# Patient Record
Sex: Female | Born: 1995 | Race: White | Hispanic: No | Marital: Married | Smoking: Never smoker
Health system: Southern US, Community
[De-identification: ages and names within clinical notes are randomized; demographics above are authoritative.]

## PROBLEM LIST (undated history)

## (undated) DIAGNOSIS — K900 Celiac disease: Secondary | ICD-10-CM

## (undated) HISTORY — PX: OTHER SURGICAL HISTORY: SHX169

## (undated) HISTORY — PX: TONSILLECTOMY: SUR1361

## (undated) HISTORY — PX: COLONOSCOPY: SHX174

---

## 2018-12-22 ENCOUNTER — Emergency Department (HOSPITAL_COMMUNITY)
Admission: EM | Admit: 2018-12-22 | Discharge: 2018-12-22 | Disposition: A | Discharge status: Home / Self Care | Payer: BC Managed Care – PPO | Attending: Emergency Medicine | Admitting: Emergency Medicine

## 2018-12-22 ENCOUNTER — Other Ambulatory Visit: Payer: Self-pay

## 2018-12-22 ENCOUNTER — Emergency Department (HOSPITAL_COMMUNITY): Payer: BC Managed Care – PPO

## 2018-12-22 DIAGNOSIS — R51 Headache: Secondary | ICD-10-CM | POA: Diagnosis not present

## 2018-12-22 DIAGNOSIS — R0789 Other chest pain: Secondary | ICD-10-CM | POA: Insufficient documentation

## 2018-12-22 DIAGNOSIS — R110 Nausea: Secondary | ICD-10-CM | POA: Insufficient documentation

## 2018-12-22 DIAGNOSIS — R079 Chest pain, unspecified: Secondary | ICD-10-CM

## 2018-12-22 DIAGNOSIS — Z79899 Other long term (current) drug therapy: Secondary | ICD-10-CM | POA: Insufficient documentation

## 2018-12-22 DIAGNOSIS — F419 Anxiety disorder, unspecified: Secondary | ICD-10-CM | POA: Insufficient documentation

## 2018-12-22 DIAGNOSIS — R5383 Other fatigue: Secondary | ICD-10-CM | POA: Diagnosis not present

## 2018-12-22 LAB — CBC
HCT: 42.2 % (ref 36.0–46.0)
Hemoglobin: 14.1 g/dL (ref 12.0–15.0)
MCH: 29.5 pg (ref 26.0–34.0)
MCHC: 33.4 g/dL (ref 30.0–36.0)
MCV: 88.3 fL (ref 80.0–100.0)
Platelets: 264 10*3/uL (ref 150–400)
RBC: 4.78 MIL/uL (ref 3.87–5.11)
RDW: 12.7 % (ref 11.5–15.5)
WBC: 6.3 10*3/uL (ref 4.0–10.5)
nRBC: 0 % (ref 0.0–0.2)

## 2018-12-22 LAB — BASIC METABOLIC PANEL
Anion gap: 12 (ref 5–15)
BUN: 5 mg/dL — ABNORMAL LOW (ref 6–20)
CO2: 24 mmol/L (ref 22–32)
Calcium: 9.8 mg/dL (ref 8.9–10.3)
Chloride: 104 mmol/L (ref 98–111)
Creatinine, Ser: 0.98 mg/dL (ref 0.44–1.00)
GFR calc Af Amer: >60 mL/min (ref >60)
GFR calc non Af Amer: >60 mL/min (ref >60)
Glucose, Bld: 93 mg/dL (ref 70–99)
Potassium: 3.7 mmol/L (ref 3.5–5.1)
Sodium: 140 mmol/L (ref 135–145)

## 2018-12-22 LAB — I-STAT BETA HCG BLOOD, ED (MC, WL, AP ONLY): I-stat hCG, quantitative: <5 m[IU]/mL (ref <5)

## 2018-12-22 LAB — TROPONIN I: Troponin I: <0.03 ng/mL (ref <0.03)

## 2018-12-22 MED ORDER — LACTATED RINGERS IV BOLUS
1000.0000 mL | Freq: Once | INTRAVENOUS | Status: AC
  Administered 2018-12-22: 1000 mL via INTRAVENOUS

## 2018-12-22 MED ORDER — KETOROLAC TROMETHAMINE 15 MG/ML IJ SOLN
15.0000 mg | Freq: Once | INTRAMUSCULAR | Status: AC
  Administered 2018-12-22: 15 mg via INTRAVENOUS
  Filled 2018-12-22: qty 1

## 2018-12-22 MED ORDER — DIPHENHYDRAMINE HCL 50 MG/ML IJ SOLN
12.5000 mg | Freq: Once | INTRAMUSCULAR | Status: AC
  Administered 2018-12-22: 12.5 mg via INTRAVENOUS
  Filled 2018-12-22: qty 1

## 2018-12-22 MED ORDER — PROCHLORPERAZINE EDISYLATE 10 MG/2ML IJ SOLN
10.0000 mg | Freq: Once | INTRAMUSCULAR | Status: AC
  Administered 2018-12-22: 10 mg via INTRAVENOUS
  Filled 2018-12-22: qty 2

## 2018-12-22 NOTE — ED Provider Notes (Signed)
[facility]ERG[facility] DEPARTMENT Provider Note   CSN: [id number] Arrival date & time: 12/22/18  1850    History   Chief Complaint No chief complaint on file.   HPI Samantha Armstrong is a 23 y.o. female.  HPI: A 23 year old patient with a history of obesity presents for evaluation of chest pain. Initial onset of pain was more than 6 hours ago. The patient's chest pain is described as heaviness/pressure/tightness and is not worse with exertion. The patient's chest pain is middle- or left-sided, is not well-localized, is not sharp and does not radiate to the arms/jaw/neck. The patient does not complain of nausea and denies diaphoresis. The patient has no history of stroke, has no history of peripheral artery disease, has not smoked in the past 90 days, denies any history of treated diabetes, has no relevant family history of coronary artery disease (first degree relative at less than age 24), is not hypertensive and has no history of hypercholesterolemia.   The history is provided by the patient and medical records.  Chest Pain Pain location:  L chest and L lateral chest Pain quality: aching, burning and tightness   Pain radiates to:  Mid back and upper back (left sided upper back, right sided lower back) Pain severity:  Mild Onset quality:  Unable to specify Duration:  3 days Timing:  Intermittent Progression:  Waxing and waning Chronicity:  Recurrent Context: breathing, movement, raising an arm, at rest and stress   Context: not trauma   Relieved by:  Nothing Worsened by:  Nothing Ineffective treatments:  Certain positions, leaning forward and rest Associated symptoms: anxiety and fatigue   Associated symptoms: no abdominal pain, no cough, no diaphoresis, no fever, no lower extremity edema, no orthopnea, no palpitations, no PND, no shortness of breath and no vomiting   Risk factors: obesity   Risk factors: no birth control, not female and no prior DVT/PE     No  past medical history on file.  There are no active problems to display for this patient.      OB History   No obstetric history on file.      Home Medications    Prior to Admission medications   Medication Sig Start Date End Date Taking? Authorizing Provider  busPIRone (BUSPAR) 5 MG tablet Take 5 mg by mouth 2 (two) times a day. for anxiety 11/10/18  Yes [provider]  ferrous sulfate 324 (65 Fe) MG TBEC Take 1 tablet by mouth daily. 12/04/18  Yes [provider]  omeprazole (PRILOSEC) 20 MG capsule Take 20 mg by mouth daily.   Yes [provider]    Family History No family history on file.  Social History Social History   Tobacco Use   Smoking status: Not on file  Substance Use Topics   Alcohol use: Not on file   Drug use: Not on file     Allergies   Other   Review of Systems Review of Systems  Constitutional: Positive for fatigue. Negative for diaphoresis and fever.  Respiratory: Negative for cough and shortness of breath.   Cardiovascular: Positive for chest pain. Negative for palpitations, orthopnea and PND.  Gastrointestinal: Negative for abdominal pain and vomiting.  All other systems reviewed and are negative.    Physical Exam Updated Vital Signs BP 117/82    Pulse 69    Temp 98.4 F (36.9 C)    Resp 12    LMP 12/21/2018 (Within Days)    SpO2 100%  Physical Exam Vitals signs and nursing note reviewed.  Constitutional:      Appearance: She is well-developed. She is not toxic-appearing.  HENT:     Head: Normocephalic and atraumatic.     Mouth/Throat:     Mouth: Mucous membranes are moist.  Eyes:     Extraocular Movements: Extraocular movements intact.     Conjunctiva/sclera: Conjunctivae normal.  Neck:     Musculoskeletal: Neck supple. No muscular tenderness.  Cardiovascular:     Rate and Rhythm: Normal rate.  Pulmonary:     Effort: Pulmonary effort is normal.     Breath sounds: No stridor. No wheezing or  rhonchi.  Abdominal:     Palpations: Abdomen is soft.     Tenderness: There is no abdominal tenderness.  Musculoskeletal:     Right lower leg: No edema.     Left lower leg: No edema.  Skin:    General: Skin is warm and dry.     Capillary Refill: Capillary refill takes less than 2 seconds.     Findings: No rash.  Neurological:     Mental Status: She is alert and oriented to person, place, and time.      ED Treatments / Results  Labs (all labs ordered are listed, but only abnormal results are displayed) Labs Reviewed  BASIC METABOLIC PANEL - Abnormal; Notable for the following components:      Result Value   BUN 5 (*)    All other components within normal limits  CBC  TROPONIN I  I-STAT BETA HCG BLOOD, ED ([facility], [facility], [facility] ONLY)    EKG EKG Interpretation  Date/Time:  Monday December 22 2018 20:27:53 EDT Ventricular Rate:  80 PR Interval:  174 QRS Duration: 103 QT Interval:  402 QTC Calculation: 464 R Axis:   60 Text Interpretation:  Sinus rhythm no acute ST/T changes No old tracing to compare Confirmed by Sherwood Gambler [phone]) on 12/22/2018 8:41:12 PM   Radiology Dg Chest 2 View  Result Date: 12/22/2018 CLINICAL DATA:  Chest pain, back pain EXAM: CHEST - 2 VIEW COMPARISON:  None. FINDINGS: Heart and mediastinal contours are within normal limits. No focal opacities or effusions. No acute bony abnormality. IMPRESSION: No active cardiopulmonary disease. Electronically Signed   By: Rolm Baptise M.D.   On: 12/22/2018 19:41    Procedures Procedures (including critical care time)  Medications Ordered in ED Medications  lactated ringers bolus 1,000 mL (1,000 mLs Intravenous New Bag/Given 12/22/18 2111)  ketorolac (TORADOL) 15 MG/ML injection 15 mg (15 mg Intravenous Given 12/22/18 2112)  diphenhydrAMINE (BENADRYL) injection 12.5 mg (12.5 mg Intravenous Given 12/22/18 2112)  prochlorperazine (COMPAZINE) injection 10 mg (10 mg Intravenous Given 12/22/18 2111)     Initial  Impression / Assessment and Plan / ED Course  I have reviewed the triage vital signs and the nursing notes.  Pertinent labs & imaging results that were available during my care of the patient were reviewed by me and considered in my medical decision making (see chart for details).     HEAR Score: 2  Medical Decision Making: Samantha Armstrong is a 23 y.o. female who presented to the ED today with chest pain, headache.  Past medical history significant for celiac's disease Reviewed and confirmed nursing documentation for past medical history, family history, social history.  On my initial exam, the pt was calm, cooperative, conversant, follows commands appropriately, GCS 15, not tachycardic, not hypotensive, afebrile, no increased work of breathing or respiratory distress, no signs of impending  respiratory failure.   The patient's risk factors for ACS were reviewed as well as the EKG. The CXR assists in r/o Pneumonia, Pneumothorax, Esophageal Tears.  No focal lung findings suggestive of pneumonia or pneumothorax. The patient does not appear to have a Pulmonary Embolism based on the Wells Score and PERC rule and there is no apparent asymmetric upper extremity or lower extremity edema/swelling suggestive of DVT.  Patient denies any fevers, change in chest pain with leaning forward or lying down making pericarditis, myocarditis less likely. There does not appear to be an Aortic Dissection either based on physical exam, historically pain not abrupt in onset, tearing or ripping, pulses symmetric. MSK strain vs Costochondritis are also of consideration however (-)Rooster crowing sign, pain not reproducible with palpation. Patient also having headache, describes headache as typical, not worst headache of life, severity in ED 4 out of 10, maximal severity 7 out of 10, no new red flag symptoms, no focal neuro deficits on exam, moves all extremities well, ambulates at baseline, no new medications, afebrile,  denies any IV drug use history, no saddle anesthesia or urinary incontinence  EKG (my interpretation):      NS rhythm with a rate of  80.      QRS 103. QTc 464.      No acute ischemic ST-T segment changes.      No acute changes suggestive of hyperkalemia.      No WPW, LQTS, or Brugada's Syndrome.      No prior EKGs for comparison  HEAR score 2 Troponin negative X-ray showed no emergent abnormality  All radiology and laboratory studies reviewed independently and with my attending physician, agree with reading provided by radiologist unless otherwise noted.  Upon reassessing patient, patient was calm, no new complaints, feeling improved after headache cocktail Based on the above findings, I believe patient is hemodynamically stable for discharge.  Patient educated about specific return precautions for given chief complaint and symptoms.  Patient educated about follow-up with PCP.  Patient expressed understanding of return precautions and need for follow-up.  Patient discharged.  The above care was discussed with and agreed upon by my attending physician. Emergency Department Medication Summary:  Medications  lactated ringers bolus 1,000 mL (1,000 mLs Intravenous New Bag/Given 12/22/18 2111)  ketorolac (TORADOL) 15 MG/ML injection 15 mg (15 mg Intravenous Given 12/22/18 2112)  diphenhydrAMINE (BENADRYL) injection 12.5 mg (12.5 mg Intravenous Given 12/22/18 2112)  prochlorperazine (COMPAZINE) injection 10 mg (10 mg Intravenous Given 12/22/18 2111)    Final Clinical Impressions(s) / ED Diagnoses   Final diagnoses:  Chest pain in adult    ED Discharge Orders    None       Lonzo Candy, MD 12/22/18 2224    Sherwood Gambler, MD 12/23/18 1242

## 2018-12-22 NOTE — ED Triage Notes (Signed)
Pt here from home with chest pain and back pain along with some slight nausea and sob

## 2018-12-22 NOTE — Discharge Instructions (Signed)

## 2018-12-24 ENCOUNTER — Emergency Department (HOSPITAL_COMMUNITY): Payer: BC Managed Care – PPO

## 2018-12-24 ENCOUNTER — Encounter (HOSPITAL_COMMUNITY): Payer: Self-pay

## 2018-12-24 ENCOUNTER — Other Ambulatory Visit: Payer: Self-pay

## 2018-12-24 ENCOUNTER — Emergency Department (HOSPITAL_COMMUNITY)
Admission: EM | Admit: 2018-12-24 | Discharge: 2018-12-25 | Disposition: A | Discharge status: Home / Self Care | Payer: BC Managed Care – PPO | Attending: Emergency Medicine | Admitting: Emergency Medicine

## 2018-12-24 DIAGNOSIS — M25561 Pain in right knee: Secondary | ICD-10-CM | POA: Insufficient documentation

## 2018-12-24 DIAGNOSIS — Z79899 Other long term (current) drug therapy: Secondary | ICD-10-CM | POA: Diagnosis not present

## 2018-12-24 HISTORY — DX: Celiac disease: K90.0

## 2018-12-24 LAB — D-DIMER, QUANTITATIVE: D-Dimer, Quant: <0.27 ug/mL-FEU (ref 0.00–0.50)

## 2018-12-24 LAB — CBC
HCT: 41.8 % (ref 36.0–46.0)
Hemoglobin: 13.9 g/dL (ref 12.0–15.0)
MCH: 29.4 pg (ref 26.0–34.0)
MCHC: 33.3 g/dL (ref 30.0–36.0)
MCV: 88.6 fL (ref 80.0–100.0)
Platelets: 226 10*3/uL (ref 150–400)
RBC: 4.72 MIL/uL (ref 3.87–5.11)
RDW: 12.4 % (ref 11.5–15.5)
WBC: 6.3 10*3/uL (ref 4.0–10.5)
nRBC: 0 % (ref 0.0–0.2)

## 2018-12-24 NOTE — ED Provider Notes (Signed)
[facility] EMERGENCY DEPARTMENT Provider Note   CSN: [id number] Arrival date & time: 12/24/18  2110    History   Chief Complaint Chief Complaint  Patient presents with  . Knee Pain    HPI Samantha Armstrong is a 23 y.o. female with a history of celiac disease and anemia who presents to the emergency department with a chief complaint of right knee pain.  The patient reports that she was driving with her right foot on the gas when she felt sudden onset, severe pain in her right knee.  She characterizes the pain as pinching.  The pain it radiates up the side of the thigh and down the side of the calf.  She reports that the pain was so severe in intensity that she had to pull over her car and was concerned that she would not be able to finish driving, but was ultimately able to finish her trip.  She reports that later today she noticed new bruising on her right shin.   She denies right ankle or hip pain, fever, chills, S, numbness, new trauma, injuries, or falls.  No history of right knee surgery.  No new exercise regimens.  No history of similar pain.  He has been ambulatory since the onset of pain.  She takes an iron supplement for anemia.  She reports that both her mother and grandfather have had multiple blood clots.  She does not take birth control.  No recent long travel.  She reports that she was seen in the ER earlier this week for chest pain, but denies chest pain or shortness of breath at this time.  No treatment prior to arrival.     The history is provided by the patient. No language interpreter was used.    Past Medical History:  Diagnosis Date  . Celiac disease     There are no active problems to display for this patient.   Past Surgical History:  Procedure Laterality Date  . COLONOSCOPY    . TONSILLECTOMY    . tubes in ears       OB History    Gravida  0   Para  0   Term  0   Preterm  0   AB  0   Living  0     SAB  0   TAB  0   Ectopic  0   Multiple  0   Live Births  0            Home Medications    Prior to Admission medications   Medication Sig Start Date End Date Taking? Authorizing Provider  busPIRone (BUSPAR) 5 MG tablet Take 5 mg by mouth 2 (two) times a day. for anxiety 11/10/18   [provider]  ferrous sulfate 324 (65 Fe) MG TBEC Take 1 tablet by mouth daily. 12/04/18   [provider]  omeprazole (PRILOSEC) 20 MG capsule Take 20 mg by mouth daily.    [provider]    Family History History reviewed. No pertinent family history.  Social History Social History   Tobacco Use  . Smoking status: Never Smoker  . Smokeless tobacco: Never Used  Substance Use Topics  . Alcohol use: Never    Frequency: Never  . Drug use: Never     Allergies   Other   Review of Systems Review of Systems  Constitutional: Negative for activity change, chills and fever.  Respiratory: Negative for shortness of breath.   Cardiovascular:  Negative for chest pain.  Gastrointestinal: Negative for abdominal pain.  Musculoskeletal: Positive for arthralgias and myalgias. Negative for back pain, neck pain and neck stiffness.  Skin: Positive for color change. Negative for rash.  Neurological: Negative for weakness and numbness.  Hematological: Bruises/bleeds easily.     Physical Exam Updated Vital Signs BP (!) 133/92 (BP Location: Right Arm)   Pulse 61   Temp 97.8 F (36.6 C) (Oral)   Resp 16   Ht 5\' 4"  (1.626 m)   Wt 97.5 kg   LMP 12/21/2018 (Within Days)   SpO2 100%   BMI 36.90 kg/m   Physical Exam Vitals signs and nursing note reviewed.  Constitutional:      General: She is not in acute distress. HENT:     Head: Normocephalic.  Eyes:     Conjunctiva/sclera: Conjunctivae normal.  Neck:     Musculoskeletal: Neck supple.  Cardiovascular:     Rate and Rhythm: Normal rate and regular rhythm.     Heart sounds: No murmur. No friction rub. No gallop.   Pulmonary:      Effort: Pulmonary effort is normal. No respiratory distress.     Breath sounds: No stridor. No wheezing, rhonchi or rales.  Chest:     Chest wall: No tenderness.  Abdominal:     General: There is no distension.     Palpations: Abdomen is soft.  Musculoskeletal:     Right lower leg: No edema.     Left lower leg: No edema.     Comments: Tender to palpation to the lateral joint line of the right knee.  She is also diffusely tender to the distal portion of the lateral right thigh and proximal portion of the lateral right calf.  There are several small bruises noted along the anterior aspect of the right lower leg.  She is also tender to palpation to the posterior aspect of the right knee.  She is neurovascularly intact.  Full active and passive range of motion of the right hip, knee, and ankle.  Pain is increased in the right knee with active range of motion.  Right hip and ankle are nontender.  There is no tenderness along the medial joint line.  Patella, quadriceps tendon, and patellar tendon are nontender.  Skin:    General: Skin is warm.     Findings: No rash.  Neurological:     Mental Status: She is alert.  Psychiatric:        Behavior: Behavior normal.      ED Treatments / Results  Labs (all labs ordered are listed, but only abnormal results are displayed) Labs Reviewed  D-DIMER, QUANTITATIVE (NOT AT [facility])  CBC    EKG None  Radiology Dg Knee Complete 4 Views Right  Result Date: 12/24/2018 CLINICAL DATA:  23 year old with atraumatic right knee pain. EXAM: RIGHT KNEE - COMPLETE 4+ VIEW COMPARISON:  None. FINDINGS: No evidence of fracture, dislocation, or joint effusion. No evidence of arthropathy or other focal bone abnormality. Soft tissues are unremarkable. IMPRESSION: Negative radiographs of the right knee. Electronically Signed   By: Narda RutherfordMelanie  Sanford M.D.   On: 12/24/2018 23:24    Procedures Procedures (including critical care time)  Medications Ordered in ED  Medications - No data to display   Initial Impression / Assessment and Plan / ED Course  I have reviewed the triage vital signs and the nursing notes.  Pertinent labs & imaging results that were available during my care of the patient were reviewed  by me and considered in my medical decision making (see chart for details).        23 year old female with history of celiac disease and anemia presenting with atraumatic right knee pain that radiates along the lateral portion of the right thigh and down the lateral portion of the right calf that began earlier today while the patient was driving her car.  She noticed some bruising along the shin that developed sometime after the pain began.  Family history includes VTE.  Differential diagnosis includes DVT, IT band syndrome, ruptured Baker's cyst, and LCL pathology.   Low suspicion for DVT as d-dimer is negative.  Patient was concerned about bruising to the lower leg and given her history of anemia, CBC was checked, which was normal.  X-ray of the right knee was negative.  No signs of intra-articular disruption.  It is possible that she could have had a small ruptured Baker's cyst, but ultimately follow-up with orthopedics in the clinic.  She was given crutches and a knee sleeve in the ER. RICE therapy recommended for home.  All questions answered.  Patient is agreeable with this plan.  Safe for discharge to home with outpatient follow-up to orthopedics.  Final Clinical Impressions(s) / ED Diagnoses   Final diagnoses:  Acute pain of right knee    ED Discharge Orders    None       Elysabeth Aust A, PA-C 12/25/18 0006    Melene PlanFloyd, Dan, DO 12/25/18 2310

## 2018-12-24 NOTE — Discharge Instructions (Addendum)
Thank you for allowing me to care for you today in the Emergency Department.   Wear the sleeve on your knee to help provide compression in the area and help with pain.  You can use the crutches as needed until you can bear weight on your right foot without significant pain.  Try to elevate your right leg so that your toes are at or above the level of your nose to help with pain and swelling.  You can apply an ice pack for 15 to 20 minutes as frequently as needed to help with pain and bruising.  Take 650 mg of Tylenol or 600 mg of ibuprofen with food once every 6 hours as needed for pain control.  Call to schedule a follow-up appointment with orthopedics.  Their contact information is listed below.  Return to the emergency department if you develop high fevers, chills, if the knee gets red and hot to the touch, if you have any fall or injury, or develop other new, concerning symptoms.

## 2018-12-24 NOTE — ED Notes (Signed)
Patient transported to X-ray 

## 2018-12-24 NOTE — ED Triage Notes (Signed)
Pt states at 1645 she was driving home and got a pain in the right posterior knee. It started out throbbing then it started burning then the burning radiated down her leg.  The pain was so "severe that her fingers turned white and felt shaky." Once pt got home she noticed "bruises behind posterior knee and down leg. Pt has 1+ edema of RLE. Pt has 2+ right pedal pulse, full ROM, cap refill less than 3 sec.

## 2018-12-24 NOTE — ED Triage Notes (Signed)
Pt states right leg feels warm on the inside.

## 2019-01-26 ENCOUNTER — Emergency Department (HOSPITAL_COMMUNITY)
Admission: EM | Admit: 2019-01-26 | Discharge: 2019-01-26 | Disposition: A | Discharge status: Home / Self Care | Payer: BC Managed Care – PPO | Attending: Emergency Medicine | Admitting: Emergency Medicine

## 2019-01-26 ENCOUNTER — Other Ambulatory Visit: Payer: Self-pay

## 2019-01-26 ENCOUNTER — Emergency Department (HOSPITAL_COMMUNITY): Payer: BC Managed Care – PPO

## 2019-01-26 ENCOUNTER — Encounter (HOSPITAL_COMMUNITY): Payer: Self-pay | Admitting: Emergency Medicine

## 2019-01-26 DIAGNOSIS — Z79899 Other long term (current) drug therapy: Secondary | ICD-10-CM | POA: Diagnosis not present

## 2019-01-26 DIAGNOSIS — R079 Chest pain, unspecified: Secondary | ICD-10-CM | POA: Insufficient documentation

## 2019-01-26 DIAGNOSIS — K900 Celiac disease: Secondary | ICD-10-CM | POA: Diagnosis not present

## 2019-01-26 LAB — I-STAT BETA HCG BLOOD, ED (MC, WL, AP ONLY): I-stat hCG, quantitative: <5 m[IU]/mL (ref <5)

## 2019-01-26 LAB — BASIC METABOLIC PANEL
Anion gap: 9 (ref 5–15)
BUN: 13 mg/dL (ref 6–20)
CO2: 24 mmol/L (ref 22–32)
Calcium: 9.2 mg/dL (ref 8.9–10.3)
Chloride: 104 mmol/L (ref 98–111)
Creatinine, Ser: 0.96 mg/dL (ref 0.44–1.00)
GFR calc Af Amer: >60 mL/min (ref >60)
GFR calc non Af Amer: >60 mL/min (ref >60)
Glucose, Bld: 117 mg/dL — ABNORMAL HIGH (ref 70–99)
Potassium: 4.1 mmol/L (ref 3.5–5.1)
Sodium: 137 mmol/L (ref 135–145)

## 2019-01-26 LAB — CBC
HCT: 44.1 % (ref 36.0–46.0)
Hemoglobin: 14.7 g/dL (ref 12.0–15.0)
MCH: 29.9 pg (ref 26.0–34.0)
MCHC: 33.3 g/dL (ref 30.0–36.0)
MCV: 89.6 fL (ref 80.0–100.0)
Platelets: 227 10*3/uL (ref 150–400)
RBC: 4.92 MIL/uL (ref 3.87–5.11)
RDW: 12.2 % (ref 11.5–15.5)
WBC: 6.5 10*3/uL (ref 4.0–10.5)
nRBC: 0 % (ref 0.0–0.2)

## 2019-01-26 LAB — TROPONIN I (HIGH SENSITIVITY): Troponin I (High Sensitivity): <2 ng/L (ref <18)

## 2019-01-26 NOTE — ED Notes (Signed)
Patient verbalizes understanding of discharge instructions. Opportunity for questioning and answers were provided. Armband removed by staff, pt discharged from ED.  

## 2019-01-26 NOTE — Discharge Instructions (Addendum)
Please read attached information. If you experience any new or worsening signs or symptoms please return to the emergency room for evaluation. Please follow-up with your primary care provider or specialist as discussed.  °

## 2019-01-26 NOTE — ED Triage Notes (Signed)
Pt reports she had chest and back pain that started at 1800hrs last night. Pt reports the pain went away and then came back early this morning. Pt describes the pain as a sharp pain.

## 2019-01-26 NOTE — ED Notes (Signed)
Patient reports having sharp pain on left side of sternum, with a dull aching pain in her left shoulder.  She also reports she bilateral breast pain, and the right breast has gotten red .  She also reports sudden cold feelings, with numbness in all extremities  For two weeks.

## 2019-01-26 NOTE — ED Provider Notes (Signed)
[facility] Provider Note   CSN: [id number] Arrival date & time: 01/26/19  0802    History   Chief Complaint Chief Complaint  Patient presents with  . Chest Pain  . Back Pain    HPI Samantha Armstrong is a 23 y.o. female.     HPI    23 year old female presents today with complaints of chest pain.  Patient notes that she developed chest pain last night sharp in nature on her left anterior chest.  She notes some pain in her back, she notes intermittent chronic pain of the muscles of her back.  She reports this happened throughout the night last night and has been persistent since this morning.  She notes it has started to move now she is having sternal pain and pain on her bilateral ribs.  Patient notes she has had these symptoms before with the addition of coldness tingling and numbness in her hands and feet.  She notes that she is followed by cardiologist that she is having irregular heartbeats and is supposed to be wearing a Holter monitor.  She denies any history DVT or PE or any significant risk factors for the same.  She notes her grandfather had heart attacks in his 7540s other than that no one in her family with significant cardiac disease.  She denies any personal cardiac history of ACS.  No medications prior to arrival.  No infectious symptoms.  No shortness of breath.    Past Medical History:  Diagnosis Date  . Celiac disease     There are no active problems to display for this patient.   Past Surgical History:  Procedure Laterality Date  . COLONOSCOPY    . TONSILLECTOMY    . tubes in ears       OB History    Gravida  0   Para  0   Term  0   Preterm  0   AB  0   Living  0     SAB  0   TAB  0   Ectopic  0   Multiple  0   Live Births  0            Home Medications    Prior to Admission medications   Medication Sig Start Date End Date Taking? Authorizing Provider  busPIRone (BUSPAR) 5 MG tablet Take 5  mg by mouth 2 (two) times a day. for anxiety 11/10/18   [provider]  ferrous sulfate 324 (65 Fe) MG TBEC Take 1 tablet by mouth daily. 12/04/18   [provider]  omeprazole (PRILOSEC) 20 MG capsule Take 20 mg by mouth daily.    [provider]    Family History No family history on file.  Social History Social History   Tobacco Use  . Smoking status: Never Smoker  . Smokeless tobacco: Never Used  Substance Use Topics  . Alcohol use: Yes    Frequency: Never  . Drug use: Never     Allergies   Other   Review of Systems Review of Systems  All other systems reviewed and are negative.    Physical Exam Updated Vital Signs BP 114/84 (BP Location: Right Arm)   Pulse 73   Temp 98.4 F (36.9 C) (Oral)   Resp 18   Ht 5\' 4"  (1.626 m)   Wt 97.5 kg   LMP 01/05/2019   SpO2 98%   BMI 36.90 kg/m   Physical Exam Vitals signs and  nursing note reviewed.  Constitutional:      Appearance: She is well-developed.  HENT:     Head: Normocephalic and atraumatic.  Eyes:     General: No scleral icterus.       Right eye: No discharge.        Left eye: No discharge.     Conjunctiva/sclera: Conjunctivae normal.     Pupils: Pupils are equal, round, and reactive to light.  Neck:     Musculoskeletal: Normal range of motion.     Vascular: No JVD.     Trachea: No tracheal deviation.  Pulmonary:     Effort: Pulmonary effort is normal.     Breath sounds: No stridor.  Neurological:     Mental Status: She is alert and oriented to person, place, and time.     Coordination: Coordination normal.  Psychiatric:        Behavior: Behavior normal.        Thought Content: Thought content normal.        Judgment: Judgment normal.      ED Treatments / Results  Labs (all labs ordered are listed, but only abnormal results are displayed) Labs Reviewed  BASIC METABOLIC PANEL - Abnormal; Notable for the following components:      Result Value   Glucose, Bld 117 (*)     All other components within normal limits  CBC  I-STAT BETA HCG BLOOD, ED ([facility], [facility], [facility] ONLY)  TROPONIN I (HIGH SENSITIVITY)  TROPONIN I (HIGH SENSITIVITY)    EKG EKG Interpretation  Date/Time:  Monday January 26 2019 08:23:01 EDT Ventricular Rate:  77 PR Interval:  156 QRS Duration: 82 QT Interval:  380 QTC Calculation: 430 R Axis:   57 Text Interpretation:  Normal sinus rhythm no acute ST/T changes similar to December 22 2018 Confirmed by Sherwood Gambler [phone]) on 01/26/2019 1:39:25 PM   Radiology No results found.  Procedures Procedures (including critical care time)  Medications Ordered in ED Medications - No data to display   Initial Impression / Assessment and Plan / ED Course  I have reviewed the triage vital signs and the nursing notes.  Pertinent labs & imaging results that were available during my care of the patient were reviewed by me and considered in my medical decision making (see chart for details).        23 year old female presents today with complaints of chest pain.  I have very low suspicion for ACS.  Uncertain etiology at this time.  No signs of dissection PE or any other life-threatening etiology.  Her pain is migrating, she is currently followed by cardiology.  She has reassuring work-up here with no significant abnormalities.  Patient discharged with strict return precautions and follow-up information.  Verbalized understanding and agreement to today's plan had no further questions or concerns.  Final Clinical Impressions(s) / ED Diagnoses   Final diagnoses:  Chest pain, unspecified type    ED Discharge Orders    None       Francee Gentile 01/28/19 [place], MD 01/29/19 1231

## 2019-02-14 ENCOUNTER — Emergency Department (HOSPITAL_COMMUNITY)
Admission: EM | Admit: 2019-02-14 | Discharge: 2019-02-14 | Disposition: A | Discharge status: Home / Self Care | Payer: BC Managed Care – PPO | Attending: Emergency Medicine | Admitting: Emergency Medicine

## 2019-02-14 ENCOUNTER — Encounter (HOSPITAL_COMMUNITY): Payer: Self-pay

## 2019-02-14 ENCOUNTER — Other Ambulatory Visit: Payer: Self-pay

## 2019-02-14 ENCOUNTER — Emergency Department (HOSPITAL_COMMUNITY): Payer: BC Managed Care – PPO

## 2019-02-14 DIAGNOSIS — M792 Neuralgia and neuritis, unspecified: Secondary | ICD-10-CM

## 2019-02-14 DIAGNOSIS — M779 Enthesopathy, unspecified: Secondary | ICD-10-CM

## 2019-02-14 DIAGNOSIS — M70841 Other soft tissue disorders related to use, overuse and pressure, right hand: Secondary | ICD-10-CM | POA: Diagnosis not present

## 2019-02-14 DIAGNOSIS — Y9389 Activity, other specified: Secondary | ICD-10-CM | POA: Diagnosis not present

## 2019-02-14 DIAGNOSIS — M79641 Pain in right hand: Secondary | ICD-10-CM | POA: Diagnosis present

## 2019-02-14 MED ORDER — IBUPROFEN 800 MG PO TABS: 800.0000 mg | ORAL_TABLET | Freq: Four times a day (QID) | ORAL | 0 refills | Status: AC | PRN

## 2019-02-14 NOTE — Discharge Instructions (Addendum)
Return here as needed.  Your x-rays did not show any abnormalities at this time.  I would use ice and elevation on your hand.  I feel that this may be an inflammatory process either involving the tendon or the nerve in that region.  Follow-up with your primary doctor.

## 2019-02-14 NOTE — ED Triage Notes (Signed)
Patient complains of right hand pain with burning x 1 day. Denies trauma but reports that she does a lot of typing, strong pulse. Full ROM

## 2019-02-24 ENCOUNTER — Other Ambulatory Visit: Payer: Self-pay | Admitting: Nurse Practitioner

## 2019-02-25 NOTE — ED Provider Notes (Signed)
[facility]   CSN: [id number] Arrival date & time: 02/14/19  [id number]     History   Chief Complaint Chief Complaint  Patient presents with  . Hand Pain    HPI Samantha Armstrong Heesch is a 23 y.o. female.     HPI Patient presents to the emergency department with right hand pain the past day.  Patient states that the burning sensation patient states she does do a lot of typing.  The patient states she is never had pain like this in the past.  Patient denies any trauma.  Patient states is worse with movements and palpation.  Patient denies any fever, nausea, vomiting, swelling, weakness, dizziness, headache, blurred vision or syncope. Past Medical History:  Diagnosis Date  . Celiac disease     There are no active problems to display for this patient.   Past Surgical History:  Procedure Laterality Date  . COLONOSCOPY    . TONSILLECTOMY    . tubes in ears       OB History    Gravida  0   Para  0   Term  0   Preterm  0   AB  0   Living  0     SAB  0   TAB  0   Ectopic  0   Multiple  0   Live Births  0            Home Medications    Prior to Admission medications   Medication Sig Start Date End Date Taking? Authorizing Provider  busPIRone (BUSPAR) 5 MG tablet Take 5 mg by mouth 2 (two) times a day. for anxiety 11/10/18   [provider]  ferrous sulfate 324 (65 Fe) MG TBEC Take 1 tablet by mouth daily. 12/04/18   [provider]  ibuprofen (ADVIL) 800 MG tablet Take 1 tablet (800 mg total) by mouth every 6 (six) hours as needed. 02/14/19   Kimothy Kishimoto, Cristal Deerhristopher, PA-C  omeprazole (PRILOSEC) 20 MG capsule Take 20 mg by mouth daily.    [provider]    Family History No family history on file.  Social History Social History   Tobacco Use  . Smoking status: Never Smoker  . Smokeless tobacco: Never Used  Substance Use Topics  . Alcohol use: Yes    Frequency: Never  . Drug use:  Never     Allergies   Other   Review of Systems Review of Systems All other systems negative except as documented in the HPI. All pertinent positives and negatives as reviewed in the HPI.  Physical Exam Updated Vital Signs BP 116/80 (BP Location: Right Arm)   Pulse 79   Temp 98.4 F (36.9 C) (Oral)   Resp 14   Ht 5\' 4"  (1.626 m)   Wt 95.3 kg   LMP 01/02/2019 (Approximate)   SpO2 100%   BMI 36.05 kg/m   Physical Exam Vitals signs and nursing note reviewed.  Constitutional:      General: She is not in acute distress.    Appearance: She is well-developed.  HENT:     Head: Normocephalic and atraumatic.  Eyes:     Pupils: Pupils are equal, round, and reactive to light.  Pulmonary:     Effort: Pulmonary effort is normal.  Musculoskeletal:     Right hand: She exhibits tenderness.  Skin:    General: Skin is warm and dry.  Neurological:     Mental Status: She is  alert and oriented to person, place, and time.      ED Treatments / Results  Labs (all labs ordered are listed, but only abnormal results are displayed) Labs Reviewed - No data to display  EKG None  Radiology No results found.  Procedures Procedures (including critical care time)  Medications Ordered in ED Medications - No data to display   Initial Impression / Assessment and Plan / ED Course  I have reviewed the triage vital signs and the nursing notes.  Pertinent labs & imaging results that were available during my care of the patient were reviewed by me and considered in my medical decision making (see chart for details).        Patient be treated for tendinitis in the hand.  Told to ice and elevate the hand.  Told to return here as needed.  Patient agrees this plan and all questions were answered.  Will be referred to orthopedics as needed.  Do not see any signs or symptoms that would suggest blood clot.  Final Clinical Impressions(s) / ED Diagnoses   Final diagnoses:  Tendinitis   Nerve pain    ED Discharge Orders         Ordered    ibuprofen (ADVIL) 800 MG tablet  Every 6 hours PRN     02/14/19 1024           Dalia Heading, PA-C 02/25/19 1606    Blanchie Dessert, MD 02/26/19 1752

## 2019-03-02 ENCOUNTER — Other Ambulatory Visit: Payer: Self-pay | Admitting: Nurse Practitioner

## 2019-03-02 DIAGNOSIS — G8929 Other chronic pain: Secondary | ICD-10-CM

## 2019-03-10 ENCOUNTER — Other Ambulatory Visit: Payer: Self-pay | Admitting: Physician Assistant

## 2019-03-10 DIAGNOSIS — R1011 Right upper quadrant pain: Secondary | ICD-10-CM

## 2019-03-19 ENCOUNTER — Other Ambulatory Visit: Payer: BC Managed Care – PPO

## 2019-03-28 ENCOUNTER — Other Ambulatory Visit: Payer: Self-pay

## 2019-03-28 ENCOUNTER — Ambulatory Visit
Admission: RE | Admit: 2019-03-28 | Discharge: 2019-03-28 | Disposition: A | Discharge status: Home / Self Care | Payer: BC Managed Care – PPO | Source: Ambulatory Visit | Attending: Nurse Practitioner | Admitting: Nurse Practitioner

## 2019-03-28 DIAGNOSIS — R519 Headache, unspecified: Secondary | ICD-10-CM

## 2019-03-28 MED ORDER — GADOBENATE DIMEGLUMINE 529 MG/ML IV SOLN
20.0000 mL | Freq: Once | INTRAVENOUS | Status: AC | PRN
  Administered 2019-03-28: 20 mL via INTRAVENOUS

## 2019-10-24 ENCOUNTER — Emergency Department (HOSPITAL_COMMUNITY): Payer: BC Managed Care – PPO

## 2019-10-24 ENCOUNTER — Encounter (HOSPITAL_COMMUNITY): Payer: Self-pay | Admitting: Emergency Medicine

## 2019-10-24 ENCOUNTER — Emergency Department (HOSPITAL_COMMUNITY)
Admission: EM | Admit: 2019-10-24 | Discharge: 2019-10-24 | Disposition: A | Discharge status: Home / Self Care | Payer: BC Managed Care – PPO | Attending: Emergency Medicine | Admitting: Emergency Medicine

## 2019-10-24 ENCOUNTER — Other Ambulatory Visit: Payer: Self-pay

## 2019-10-24 DIAGNOSIS — M79605 Pain in left leg: Secondary | ICD-10-CM | POA: Insufficient documentation

## 2019-10-24 DIAGNOSIS — R0602 Shortness of breath: Secondary | ICD-10-CM | POA: Diagnosis not present

## 2019-10-24 DIAGNOSIS — Z79899 Other long term (current) drug therapy: Secondary | ICD-10-CM | POA: Insufficient documentation

## 2019-10-24 DIAGNOSIS — R2242 Localized swelling, mass and lump, left lower limb: Secondary | ICD-10-CM | POA: Insufficient documentation

## 2019-10-24 LAB — BASIC METABOLIC PANEL
Anion gap: 10 (ref 5–15)
BUN: 16 mg/dL (ref 6–20)
CO2: 25 mmol/L (ref 22–32)
Calcium: 9.3 mg/dL (ref 8.9–10.3)
Chloride: 106 mmol/L (ref 98–111)
Creatinine, Ser: 1.01 mg/dL — ABNORMAL HIGH (ref 0.44–1.00)
GFR calc Af Amer: >60 mL/min (ref >60)
GFR calc non Af Amer: >60 mL/min (ref >60)
Glucose, Bld: 93 mg/dL (ref 70–99)
Potassium: 5 mmol/L (ref 3.5–5.1)
Sodium: 141 mmol/L (ref 135–145)

## 2019-10-24 LAB — CBC
HCT: 41.7 % (ref 36.0–46.0)
Hemoglobin: 13.9 g/dL (ref 12.0–15.0)
MCH: 30.1 pg (ref 26.0–34.0)
MCHC: 33.3 g/dL (ref 30.0–36.0)
MCV: 90.3 fL (ref 80.0–100.0)
Platelets: 254 10*3/uL (ref 150–400)
RBC: 4.62 MIL/uL (ref 3.87–5.11)
RDW: 11.9 % (ref 11.5–15.5)
WBC: 5.8 10*3/uL (ref 4.0–10.5)
nRBC: 0 % (ref 0.0–0.2)

## 2019-10-24 LAB — I-STAT BETA HCG BLOOD, ED (MC, WL, AP ONLY): I-stat hCG, quantitative: <5 m[IU]/mL (ref <5)

## 2019-10-24 MED ORDER — IOHEXOL 350 MG/ML SOLN
100.0000 mL | Freq: Once | INTRAVENOUS | Status: AC | PRN
  Administered 2019-10-24: 21:00:00 100 mL via INTRAVENOUS

## 2019-10-24 NOTE — ED Triage Notes (Signed)
C/o L calf pain x 2 days.  No known injury.  Received Wynetta Emery and [employer] COVID vaccine 1 week ago.

## 2019-10-24 NOTE — ED Provider Notes (Addendum)
[place] EMERGENCY DEPARTMENT Provider Note   CSN: [id number] Arrival date & time: 10/24/19  1653     History Chief Complaint  Patient presents with  . Leg Pain    Samantha Armstrong is a 24 y.o. female.  HPI 24 year old female with a history of celiac disease presents to the ER with 2-day swelling and pain in her left leg/calf with concern for blood clot.  Patient noted numbness and tingling in her left calf 2 days ago, with progressing pain as of today.  Patient notes pain worse with movement.  She has not tried to take anything for this pain.  She also reports intermittent episodes of shortness of breath as of today.  Refers that she got the [employer] Covid vaccine approximately week ago.  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  She does have celiac disease.  She does not have any personal history of clots but states that she does have a family history.  Does not take any blood thinners.  No fever, chills, chest pain, hemoptysis, syncope.    Past Medical History:  Diagnosis Date  . Celiac disease     There are no problems to display for this patient.   Past Surgical History:  Procedure Laterality Date  . COLONOSCOPY    . TONSILLECTOMY    . tubes in ears       OB History    Gravida  0   Para  0   Term  0   Preterm  0   AB  0   Living  0     SAB  0   TAB  0   Ectopic  0   Multiple  0   Live Births  0           No family history on file.  Social History   Tobacco Use  . Smoking status: Never Smoker  . Smokeless tobacco: Never Used  Substance Use Topics  . Alcohol use: Yes  . Drug use: Never    Home Medications Prior to Admission medications   Medication Sig Start Date End Date Taking? Authorizing Provider  busPIRone (BUSPAR) 5 MG tablet Take 5 mg by mouth 2 (two) times a day. for anxiety 11/10/18   [provider]  ferrous sulfate 324 (65 Fe) MG  TBEC Take 1 tablet by mouth daily. 12/04/18   [provider]  ibuprofen (ADVIL) 800 MG tablet Take 1 tablet (800 mg total) by mouth every 6 (six) hours as needed. 02/14/19   Lawyer, Harrell Gave, PA-C  omeprazole (PRILOSEC) 20 MG capsule Take 20 mg by mouth daily.    [provider]    Allergies    Other  Review of Systems   Review of Systems  Constitutional: Positive for fever. Negative for chills.  HENT: Negative for rhinorrhea and sore throat.   Eyes: Negative for pain and discharge.  Respiratory: Positive for shortness of breath. Negative for cough, chest tightness and wheezing.   Cardiovascular: Negative for chest pain and palpitations.  Gastrointestinal: Negative for abdominal pain, diarrhea, nausea and vomiting.  Genitourinary: Negative for dysuria and hematuria.  Musculoskeletal: Negative for back pain.  Skin: Negative for color change.  Neurological: Positive for numbness. Negative for syncope, weakness and headaches.  Psychiatric/Behavioral: Negative for confusion.    Physical Exam Updated Vital Signs BP 127/78   Pulse 70   Temp 98.3 F (36.8 C)  Resp 16   LMP 10/24/2019   SpO2 99%   Physical Exam Vitals reviewed.  Constitutional:      General: She is not in acute distress.    Appearance: Normal appearance. She is not ill-appearing, toxic-appearing or diaphoretic.  HENT:     Head: Normocephalic and atraumatic.     Mouth/Throat:     Mouth: Mucous membranes are moist.     Pharynx: Oropharynx is clear.  Eyes:     General:        Right eye: No discharge.        Left eye: No discharge.     Extraocular Movements: Extraocular movements intact.     Conjunctiva/sclera: Conjunctivae normal.     Pupils: Pupils are equal, round, and reactive to light.  Cardiovascular:     Rate and Rhythm: Normal rate and regular rhythm.     Pulses: Normal pulses.     Heart sounds: Normal heart sounds.  Pulmonary:     Effort: Pulmonary effort is normal. No  respiratory distress.     Breath sounds: Normal breath sounds. No stridor. No wheezing, rhonchi or rales.  Chest:     Chest wall: No tenderness.  Abdominal:     General: Abdomen is flat.     Palpations: Abdomen is soft.     Tenderness: There is no abdominal tenderness. There is no right CVA tenderness or left CVA tenderness.  Musculoskeletal:        General: No swelling. Normal range of motion.     Cervical back: Normal range of motion and neck supple.     Comments: Mild tenderness to palpation to popliteal fossa, calf, right lateral side of left shin.  No noticeable erythema, swelling.  Calfs equal in size.  Negative Homans' sign.  Skin:    General: Skin is warm and dry.     Capillary Refill: Capillary refill takes less than 2 seconds.     Findings: No bruising, erythema or rash.  Neurological:     General: No focal deficit present.     Mental Status: She is alert and oriented to person, place, and time.     Motor: No weakness.  Psychiatric:        Mood and Affect: Mood normal.        Behavior: Behavior normal.     ED Results / Procedures / Treatments   Labs (all labs ordered are listed, but only abnormal results are displayed) Labs Reviewed  BASIC METABOLIC PANEL - Abnormal; Notable for the following components:      Result Value   Creatinine, Ser 1.01 (*)    All other components within normal limits  CBC  I-STAT BETA HCG BLOOD, ED ([facility], [facility], [facility] ONLY)    EKG None  Radiology CT Angio Chest PE W and/or Wo Contrast  Result Date: 10/24/2019 CLINICAL DATA:  Shortness of breath. Left calf pain for the past 2 days. Received Wynetta Emery and [employer] COVID-19 vaccine 1 week ago. EXAM: CT ANGIOGRAPHY CHEST WITH CONTRAST TECHNIQUE: Multidetector CT imaging of the chest was performed using the standard protocol during bolus administration of intravenous contrast. Multiplanar CT image reconstructions and MIPs were obtained to evaluate the vascular anatomy. CONTRAST:  169m OMNIPAQUE IOHEXOL  350 MG/ML SOLN COMPARISON:  Chest radiographs dated 01/26/2019 FINDINGS: Cardiovascular: Satisfactory opacification of the pulmonary arteries to the segmental level. No evidence of pulmonary embolism. Normal heart size. No pericardial effusion. Mediastinum/Nodes: No enlarged mediastinal, hilar, or axillary lymph nodes. Thyroid gland, trachea, and esophagus demonstrate  no significant findings. Lungs/Pleura: Minimal diffuse ground-glass opacity in both lungs with multiple small areas of mild centrilobular and paraseptal sparing an appearance compatible with early bullous changes. No airspace consolidation or pleural fluid. Upper Abdomen: Unremarkable. Musculoskeletal: Normal appearing bones. Review of the MIP images confirms the above findings. IMPRESSION: 1. No pulmonary emboli. 2. Mild changes of COPD. Since the patient has no history of smoking, this could be due to passive smoke exposure. Alternatively, this could represent minimal changes of acute pneumonitis with mild underlying emphysematous changes. Emphysema (ICD10-J43.9). Electronically Signed   By: Claudie Revering M.D.   On: 10/24/2019 20:52    Procedures Procedures (including critical care time)  Medications Ordered in ED Medications  iohexol (OMNIPAQUE) 350 MG/ML injection 100 mL (100 mLs Intravenous Contrast Given 10/24/19 2036)    ED Course  I have reviewed the triage vital signs and the nursing notes.  Pertinent labs & imaging results that were available during my care of the patient were reviewed by me and considered in my medical decision making (see chart for details).    MDM Rules/Calculators/A&P                     24 year old with celiac disease presents to the ER for concern for blood clot in her left calf after receiving the [employer] vaccine a week ago. On presentation to the ER, patient is in no acute distress, is not tachypneic, nondiaphoretic, resting comfortably in the ER bed.  Speaking in full sentences without any  respiratory distress.  Vitals nonconcerning.  On physical exam I do not appreciate any visible erythema or swelling to the left calf.  Patient is tender to palpation to the posterior calf and medial side of shin.  Negative Homans' sign.  Given reported great shortness of breath today, family history of DVTs, and recent [employer] vaccine, will order DVT study/CTA PE for rule out.  CTA results below   IMPRESSION:  1. No pulmonary emboli.  2. Mild changes of COPD. Since the patient has no history of  smoking, this could be due to passive smoke exposure. Alternatively,  this could represent minimal changes of acute pneumonitis with mild  underlying emphysematous changes.   CT negative for PE.  CBC without leukocytosis, normal hemoglobin.  Beta-hCG negative, BMP without significant electrolyte abnormalities.  Mildly elevated creatinine, likely secondary to dehydration.  No ultrasound techs available after 7 PM for DVT study, will refer patient for ambulatory DVT study tomorrow.  Patient is not tachycardic, not currently short of breath, vitals reassuring.  Concern for cellulitis low.  She does not have any cough, fevers, any signs of URI.  Patient reports concern over mild COPD changes to her lungs as she states that she does have some family history of COPD and lung problems.  I encouraged her to follow-up with her PCP about this.  I discussed her returning to the ER for follow-up DVT scan tomorrow.  She voices understanding is agreeable to this plan.  At this stage, PE ruled out, patient is stable for discharge and will return for rule out DVT tomorrow.    Final Clinical Impression(s) / ED Diagnoses Final diagnoses:  Left leg pain    Rx / DC Orders ED Discharge Orders         Ordered    LE VENOUS     10/24/19 2124           Garald Balding, PA-C 10/24/19 2216  Garald Balding, PA-C 10/24/19 2228    Tegeler, Gwenyth Allegra, MD 10/25/19 [phone]

## 2019-10-24 NOTE — Discharge Instructions (Addendum)
You were seen in the ER for possible DVT/pulmonary embolism.  Your CT scan did not show any evidence of clots.  Your CT scan did show the following: Mild changes of COPD. Since the patient has no history of  smoking, this could be due to passive smoke exposure. Alternatively,  this could represent minimal changes of acute pneumonitis with mild  underlying emphysematous changes.  Please make sure to follow-up with your primary care provider for further management given your family history of COPD and lung problems.  Our ultrasound techs are not available after 7 PM in the ER, please return to the ER tomorrow and let them know that you are here for a vascular study.  Return to the ER if your symptoms worsen.

## 2019-10-25 ENCOUNTER — Ambulatory Visit (HOSPITAL_COMMUNITY)
Admission: RE | Admit: 2019-10-25 | Discharge: 2019-10-25 | Disposition: A | Discharge status: Home / Self Care | Payer: BC Managed Care – PPO | Source: Ambulatory Visit | Attending: Physician Assistant | Admitting: Physician Assistant

## 2019-10-25 DIAGNOSIS — M79609 Pain in unspecified limb: Secondary | ICD-10-CM

## 2019-10-25 DIAGNOSIS — M79662 Pain in left lower leg: Secondary | ICD-10-CM | POA: Insufficient documentation

## 2019-10-25 DIAGNOSIS — M7989 Other specified soft tissue disorders: Secondary | ICD-10-CM | POA: Insufficient documentation

## 2019-10-25 NOTE — Progress Notes (Signed)
VASCULAR LAB PRELIMINARY  PRELIMINARY  PRELIMINARY  PRELIMINARY  Left lower extremity venous duplex completed.    Preliminary report:  Se CV proc for preliminary results.   Zacharie Portner, RVT 10/25/2019, 1:46 PM

## 2020-11-19 IMAGING — MR MR HEAD WO/W CM
10 series · 48 of 48 positions shown · IV contrast (multihance)
Comparison: None.

CLINICAL DATA: Predominantly left-sided daily headaches for 6
months.

EXAM:
MRI HEAD WITHOUT AND WITH CONTRAST
TECHNIQUE: Multiplanar, multiecho pulse sequences of the brain and surrounding
structures were obtained without and with intravenous contrast.
CONTRAST:  20mL MULTIHANCE GADOBENATE DIMEGLUMINE 529 MG/ML IV SOLN

[Series 2: T1 · sagittal · 5.0mm · 0.45mm/px · 2 of 21 slices shown]
[im 1/21]
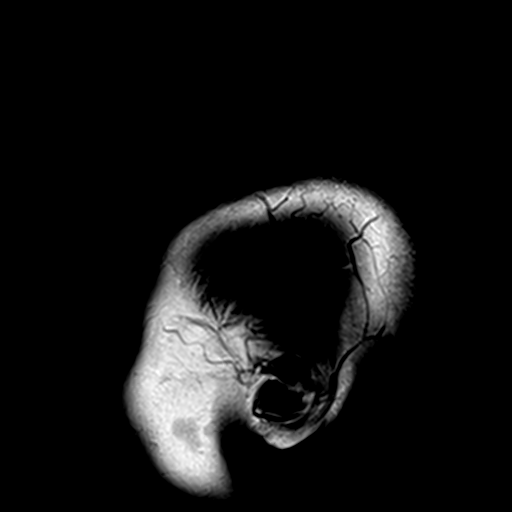
[im 21/21]
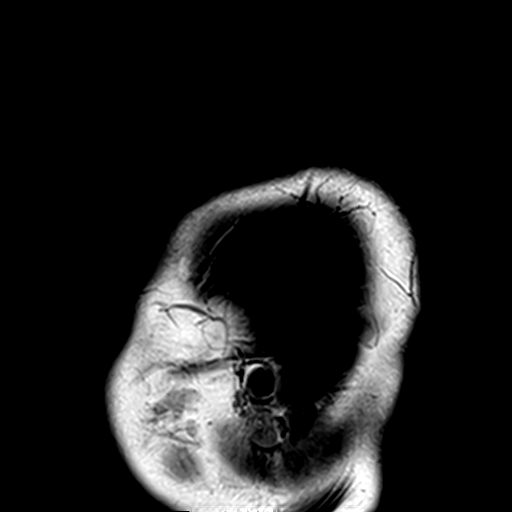

[Series 4: DWI · axial · 3.0mm · 1.80mm/px · z∈[-52,+77]mm · 7 of 88 slices shown (1 of 2)]
[im 1/88]
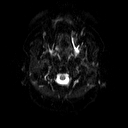
[im 15/88]
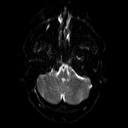
[im 30/88]
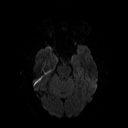
[im 44/88]
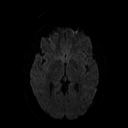
[im 59/88]
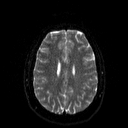
[im 73/88]
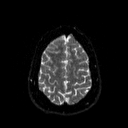
[im 88/88]
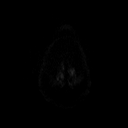

[Series 5: DWI · axial · 3.0mm · 1.80mm/px · z∈[-52,+77]mm · 4 of 43 slices shown (2 of 2)]
[im 1/43]
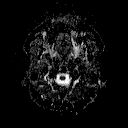
[im 15/43]
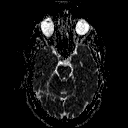
[im 29/43]
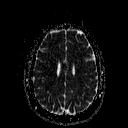
[im 43/43]
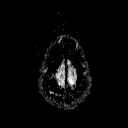

[Series 7: swi_images · axial · 4.0mm · 0.90mm/px · z∈[-58,+82]mm · 3 of 36 slices shown]
[im 1/36]
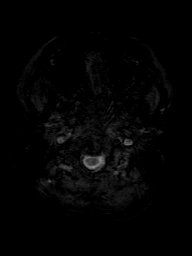
[im 18/36]
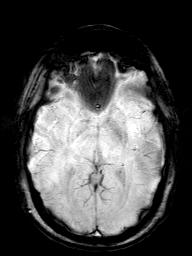
[im 36/36]
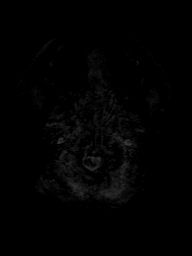

[Series 8: T2 · axial · 5.0mm · 0.51mm/px · z∈[-53,+78]mm · 2 of 22 slices shown (1 of 2)]
[im 1/22]
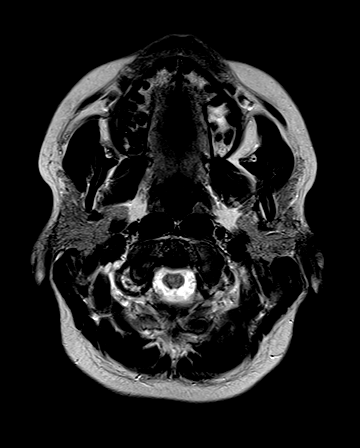
[im 22/22]
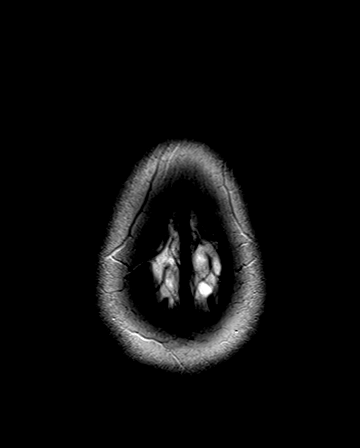

[Series 9: FLAIR · axial · 3.0mm · 0.45mm/px · z∈[-54,+80]mm · 2 of 30 slices shown]
[im 1/30]
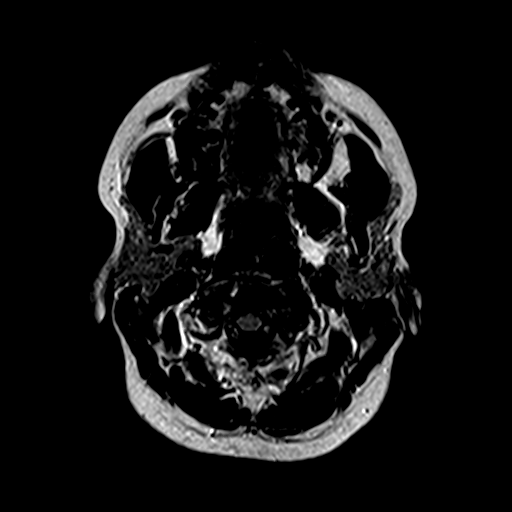
[im 30/30]
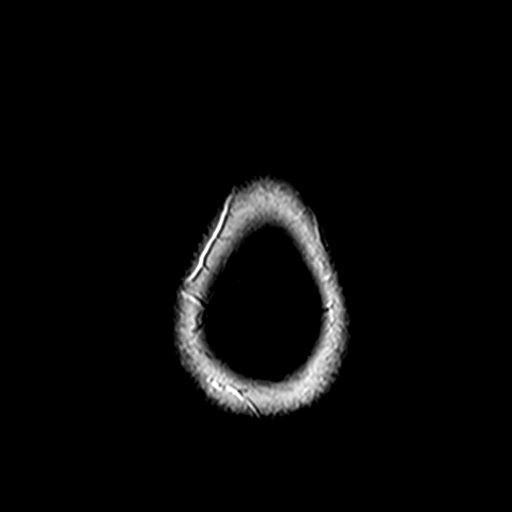

[Series 10: t1_mpr_tra · axial · 1.0mm · 0.75mm/px · z∈[-59,+84]mm · 12 of 144 slices shown (1 of 2)]
[im 1/144]
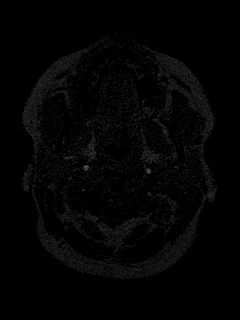
[im 14/144]
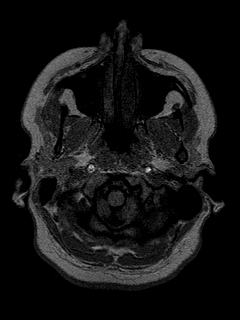
[im 27/144]
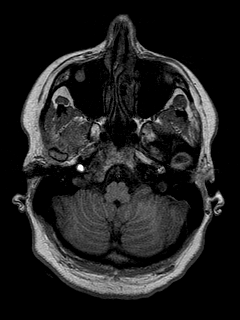
[im 40/144]
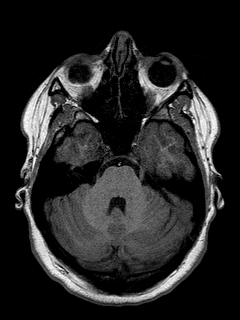
[im 53/144]
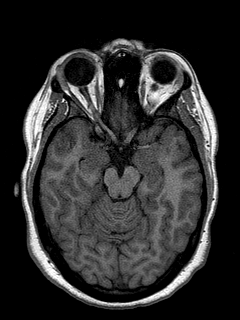
[im 66/144]
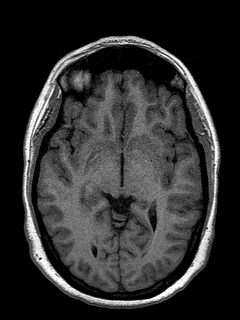
[im 79/144]
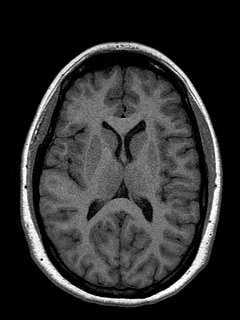
[im 92/144]
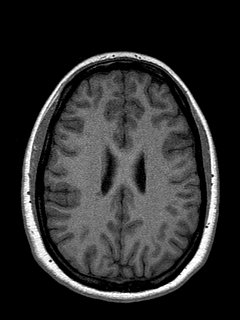
[im 105/144]
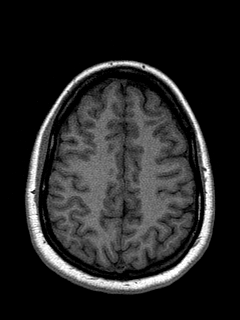
[im 118/144]
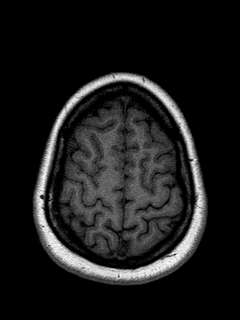
[im 131/144]
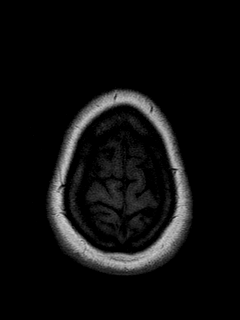
[im 144/144]
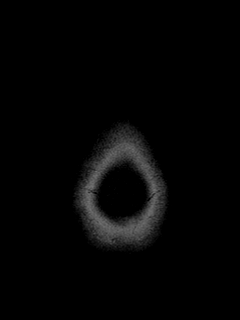

[Series 11: T2 · coronal · 5.0mm · 0.45mm/px · 2 of 26 slices shown (2 of 2)]
[im 1/26]
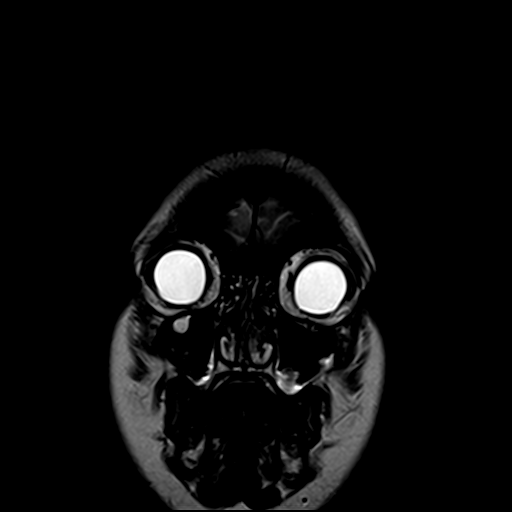
[im 26/26]
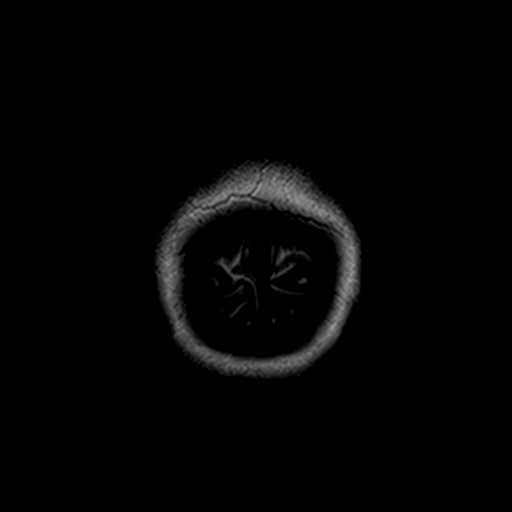

[Series 12: t1_mpr_tra · axial · 1.0mm · 0.75mm/px · z∈[-59,+84]mm · 12 of 144 slices shown (2 of 2)]
[im 1/144]
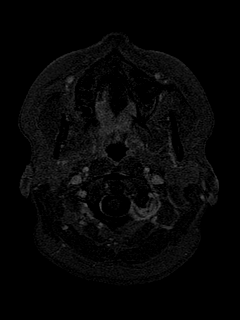
[im 14/144]
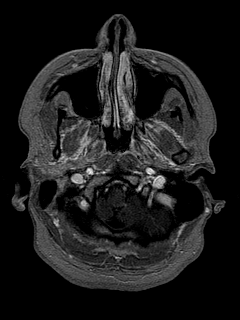
[im 27/144]
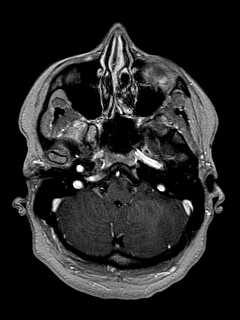
[im 40/144]
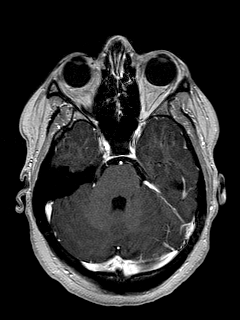
[im 53/144]
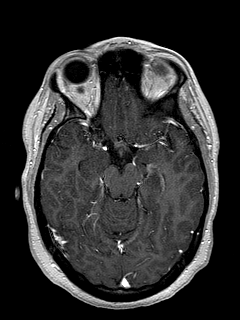
[im 66/144]
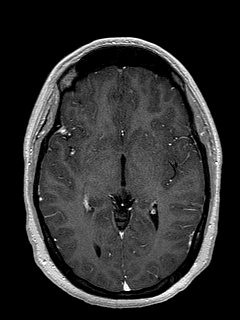
[im 79/144]
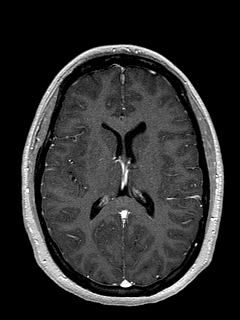
[im 92/144]
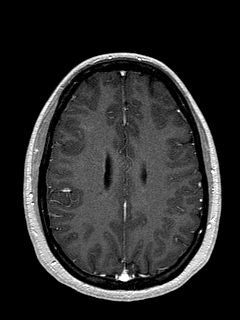
[im 105/144]
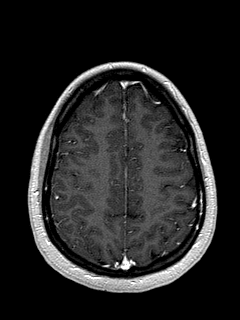
[im 118/144]
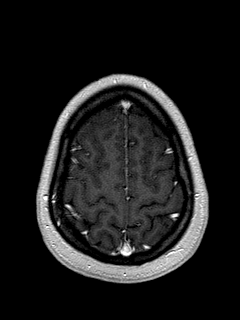
[im 131/144]
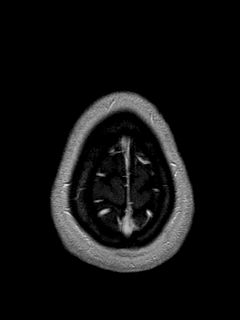
[im 144/144]
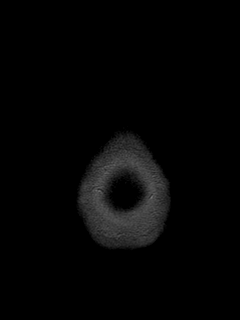

[Series 13: post cor · coronal · 5.0mm · 0.45mm/px · 2 of 26 slices shown]
[im 1/26]
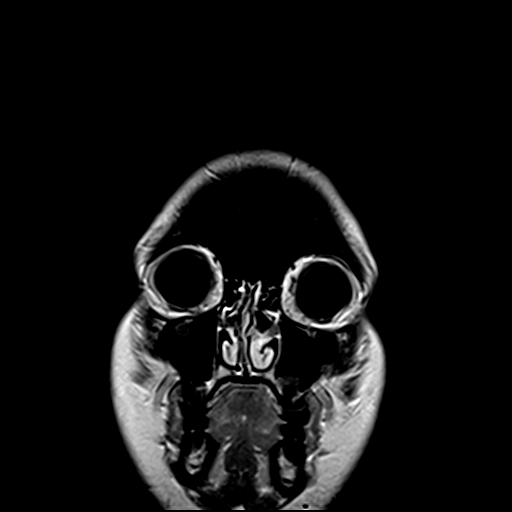
[im 26/26]
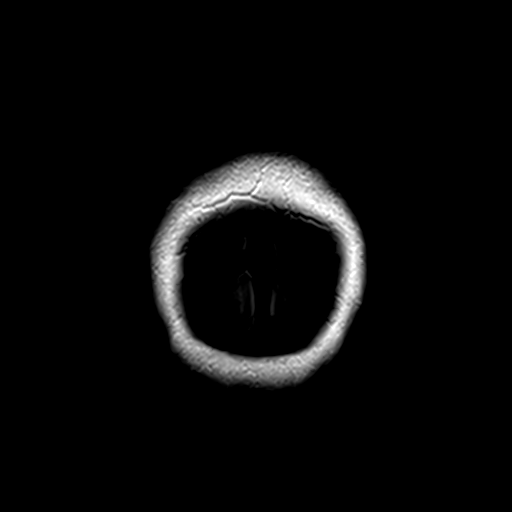

[48 of 48 positions shown; findings below may reference images not displayed]

FINDINGS: Brain: There is no evidence of acute infarct, intracranial
hemorrhage, mass, midline shift, or extra-axial fluid collection.
The ventricles and sulci are normal. The brain is normal in signal.
There is no cerebellar tonsillar ectopia. No abnormal enhancement is
identified.

Vascular: Major intracranial vascular flow voids are preserved.

Skull and upper cervical spine: Unremarkable bone marrow signal.

Sinuses/Orbits: Unremarkable orbits. Mild mucosal
thickening/scattered small retention cysts in the paranasal sinuses.
Clear mastoid air cells.

Other: None.
IMPRESSION: Unremarkable appearance of the brain.
